# Patient Record
Sex: Female | Born: 1970 | Hispanic: Yes | Marital: Married | Smoking: Never smoker
Health system: Southern US, Community
[De-identification: ages and names within clinical notes are randomized; demographics above are authoritative.]

---

## 2008-02-12 ENCOUNTER — Ambulatory Visit: Payer: Self-pay | Admitting: Obstetrics and Gynecology

## 2008-02-26 ENCOUNTER — Ambulatory Visit: Payer: Self-pay | Admitting: Obstetrics and Gynecology

## 2008-02-26 ENCOUNTER — Inpatient Hospital Stay: Payer: Self-pay | Admitting: Obstetrics and Gynecology

## 2012-05-07 ENCOUNTER — Ambulatory Visit: Payer: Self-pay | Admitting: Internal Medicine

## 2013-07-03 ENCOUNTER — Ambulatory Visit: Payer: Self-pay

## 2014-09-17 ENCOUNTER — Ambulatory Visit: Payer: Self-pay

## 2016-11-10 ENCOUNTER — Other Ambulatory Visit: Payer: Self-pay | Admitting: Internal Medicine

## 2016-11-10 DIAGNOSIS — Z1231 Encounter for screening mammogram for malignant neoplasm of breast: Secondary | ICD-10-CM

## 2016-11-21 ENCOUNTER — Encounter (INDEPENDENT_AMBULATORY_CARE_PROVIDER_SITE_OTHER): Payer: Self-pay

## 2016-11-21 ENCOUNTER — Ambulatory Visit: Payer: Self-pay | Attending: Oncology

## 2016-11-21 ENCOUNTER — Ambulatory Visit
Admission: RE | Admit: 2016-11-21 | Discharge: 2016-11-21 | Disposition: A | Discharge status: Home / Self Care | Payer: Self-pay | Source: Ambulatory Visit | Attending: Oncology | Admitting: Oncology

## 2016-11-21 VITALS — BP 101/65 | HR 68 | Temp 98.5°F | Ht 60.63 in | Wt 164.5 lb

## 2016-11-21 DIAGNOSIS — Z0000 Encounter for general adult medical examination without abnormal findings: Secondary | ICD-10-CM

## 2016-11-21 NOTE — Progress Notes (Signed)
Letter mailed from [facility] to notify of normal mammogram results.  Patient to return in one year for annual screening.  Copy to [facility]. 

## 2016-11-21 NOTE — Progress Notes (Signed)
Subjective:     Patient ID: Noland FordyceModesta Ramon Trujillo, female   DOB: 1971/08/25, 45 y.o.   MRN: [id number]  HPI   Review of Systems     Objective:   Physical Exam  Pulmonary/Chest: Right breast exhibits no inverted nipple, no mass, no nipple discharge, no skin change and no tenderness. Left breast exhibits no inverted nipple, no mass, no nipple discharge, no skin change and no tenderness. Breasts are symmetrical.       Assessment:     45 year old Hispanic patient presents for [facility] clinic visit.  Patient screened, and meets B[facility]CP eligibility.  Patient does not have insurance, Medicare or Medicaid.  Handout given on Affordable Care Act.  Instructed patient on breast self-exam using teach back method.  CBE unremarkable.  No mass or lump palpated.    Plan:     Sent for bilateral screening mammogram.

## 2018-03-28 ENCOUNTER — Ambulatory Visit
Admission: RE | Admit: 2018-03-28 | Discharge: 2018-03-28 | Disposition: A | Discharge status: Home / Self Care | Payer: Self-pay | Source: Ambulatory Visit | Attending: Oncology | Admitting: Oncology

## 2018-03-28 ENCOUNTER — Ambulatory Visit: Payer: Self-pay | Attending: Oncology

## 2018-03-28 VITALS — BP 106/75 | HR 71 | Temp 98.5°F | Ht 60.0 in | Wt 161.0 lb

## 2018-03-28 DIAGNOSIS — Z0000 Encounter for general adult medical examination without abnormal findings: Secondary | ICD-10-CM

## 2018-03-28 NOTE — Progress Notes (Signed)
Subjective:     Patient ID: Rachel FordyceModesta Ramon Malone, female   DOB: May 06, 1971, 47 y.o.   MRN: [id number]  HPI   Review of Systems     Objective:   Physical Exam  Pulmonary/Chest: Right breast exhibits no inverted nipple, no mass, no nipple discharge, no skin change and no tenderness. Left breast exhibits no inverted nipple, no mass, no nipple discharge, no skin change and no tenderness. Breasts are symmetrical.       Assessment:     47 year old [facility].  Patient screened, and meets [facility] eligibility.  Patient does not have insurance, Medicare or Medicaid.  Handout given on Affordable Care Act.  Instructed patient on breast self awareness using teach back method.  CBE unremarkable.  No mass or lump palpated.  Delos HaringLoyda Murr interpreted exam.    Plan:     Sent for bilateral screening mammogram.

## 2018-03-28 NOTE — Progress Notes (Signed)
Sent for bilateral screening mammogram.  Letter mailed from [facility] to notify of normal mammogram results.  Patient to return in one year for annual screening.  Copy to [facility]. 

## 2020-05-27 ENCOUNTER — Other Ambulatory Visit: Payer: Self-pay

## 2020-05-27 ENCOUNTER — Other Ambulatory Visit: Payer: Self-pay | Admitting: Oncology

## 2020-05-27 ENCOUNTER — Ambulatory Visit: Payer: Self-pay | Attending: Oncology

## 2020-05-27 ENCOUNTER — Ambulatory Visit
Admission: RE | Admit: 2020-05-27 | Discharge: 2020-05-27 | Disposition: A | Discharge status: Home / Self Care | Payer: Self-pay | Source: Ambulatory Visit | Attending: Oncology | Admitting: Oncology

## 2020-05-27 VITALS — BP 115/84 | HR 68 | Temp 98.8°F | Resp 18 | Ht 59.0 in | Wt 159.6 lb

## 2020-05-27 DIAGNOSIS — Z0000 Encounter for general adult medical examination without abnormal findings: Secondary | ICD-10-CM

## 2020-05-27 NOTE — Progress Notes (Signed)
  Subjective:     Patient ID: Rachel Malone, female   DOB: 12/01/1971, 49 y.o.   MRN: [id number]  HPI   Review of Systems     Objective:   Physical Exam     Assessment:     49 year old Hispanic patient returns for [facility] screening.  Jaqui Laukaitis interpreted exam.  Patient screened, and meets BCCCP eligibility.  Patient does not have insurance, Medicare or Medicaid. Instructed patient on breast self awareness using teach back method. Clinical breast exam unremarkable.    Plan:     Sent for bilateral screening mammogram.Sent for bilateral screening mammogram.

## 2020-05-27 NOTE — Progress Notes (Signed)
Risk Assessment    No risk assessment data for the current encounter   Risk Scores      05/27/2020   Last edited by: Alta Corning, CMA   5-year risk: 0.6 %   Lifetime risk: 5.8 %        Letter mailed from [facility] to notify of normal mammogram results.  Patient to return in one year for annual screening.  Pap results pending. Pap results LSIL/HPV negative.  Previous pap 11/08/16 Negative/ no HPV testing.  Per ASCCP guidelines patient will need a repeat pap in one year.  Mailed letter with results ,and pap recommendation.

## 2020-08-25 LAB — IGP, APTIMA HPV: HPV Aptima: NEGATIVE

## 2021-06-01 ENCOUNTER — Encounter (INDEPENDENT_AMBULATORY_CARE_PROVIDER_SITE_OTHER): Payer: Self-pay

## 2021-06-01 ENCOUNTER — Other Ambulatory Visit: Payer: Self-pay

## 2021-06-01 ENCOUNTER — Ambulatory Visit
Admission: RE | Admit: 2021-06-01 | Discharge: 2021-06-01 | Disposition: A | Discharge status: Home / Self Care | Payer: Self-pay | Source: Ambulatory Visit | Attending: Oncology | Admitting: Oncology

## 2021-06-01 ENCOUNTER — Ambulatory Visit: Payer: Self-pay | Attending: Oncology | Admitting: *Deleted

## 2021-06-01 ENCOUNTER — Encounter: Payer: Self-pay | Admitting: *Deleted

## 2021-06-01 VITALS — BP 120/80 | HR 63 | Temp 97.5°F | Ht 59.0 in | Wt 159.7 lb

## 2021-06-01 DIAGNOSIS — Z0000 Encounter for general adult medical examination without abnormal findings: Secondary | ICD-10-CM | POA: Insufficient documentation

## 2021-06-01 NOTE — Progress Notes (Signed)
Subjective:     Patient ID: Rachel Malone, female   DOB: 11-Aug-1971, 50 y.o.   MRN: [id number]  HPI   [facility] Medical History Record - 06/01/21 1017      Breast History   Screening cycle New    Provider ([facility]    Initial Mammogram 06/01/21    Last Mammogram Annual    Last Mammogram Date 05/27/20    Provider (Mammogram)  Delford Field    Recent Breast Symptoms None      Breast Cancer History   Breast Cancer History No personal or family history      Previous History of Breast Problems   Breast Surgery or Biopsy None    Breast Implants N/A    BSE Done Never      Gynecological/Obstetrical History   LMP --   february 10 after 2years of not having one   Is there any chance that the client could be pregnant?  No    Age at menarche 50    Age at menopause perimenopause    PAP smear history Annually    Date of last PAP  01/28/21    Provider (PAP) [facility]    Age at first live birth 50    Breast fed children Yes (type length in comments)   3 months   DES Exposure No    Cervical, Uterine or Ovarian cancer No    Family history of Cervial, Uterine or Ovarian cancer No    Hysterectomy No    Cervix removed No    Ovaries removed No    Laser/Cryosurgery No    Current method of birth control None    Current method of Estrogen/Hormone replacement None    Smoking history None    Comments No Insurance            Review of Systems     Objective:   Physical Exam Chest:  Breasts:     Right: No swelling, bleeding, inverted nipple, mass, nipple discharge, skin change, tenderness, axillary adenopathy or supraclavicular adenopathy.     Left: No swelling, bleeding, inverted nipple, mass, nipple discharge, skin change, tenderness, axillary adenopathy or supraclavicular adenopathy.    Lymphadenopathy:     Upper Body:     Right upper body: No supraclavicular or axillary adenopathy.     Left upper body: No supraclavicular or axillary adenopathy.         Assessment:     50 year old English speaking Hispanic female returns to [facility] for annual screening.  Clinical breast exam is unremarkable. Taught self breast awareness.  Last pap on 01/28/21 was negative / LGSIL.  Per ASCCP guidelines, next pap will be due in 1 year.  Patient states she has had 1 episode of post menopausal spotting in February of 2022.  States this was after 2 years of no periods, and lasted about 1-2 days.  Denies post coital bleeding, and states it was not after her pap smear, but at a different time.  Discussed that [facility] does not cover referrals for post menopausal bleeding, but that I feel like she should probably have a gyn consult for the that episode of bleeding.  Discussed with her to inform her PCP at the [facility], and that I will also send them my notes.  Patient has been screened for eligibility.  She does not have any insurance, Medicare or Medicaid.  She also meets financial eligibility.   Risk Assessment    Risk Scores  06/01/2021 05/27/2020   Last edited by: Scarlett Presto, RN [place], Freada Bergeron, CMA   5-year risk: 0.6 % 0.6 %   Lifetime risk: 5.7 % 5.8 %            Plan:     Screening mammogram ordered.  Routed notes to Lorenda Cahill, FNP at the [facility]  Will follow up per [facility] protocol.

## 2021-06-01 NOTE — Patient Instructions (Signed)
Gave patient hand-out, Women Staying Healthy, Active and Well from [facility], with education on breast health, pap smears, heart and colon health. 

## 2021-06-03 ENCOUNTER — Encounter: Payer: Self-pay | Admitting: *Deleted

## 2021-06-03 NOTE — Progress Notes (Signed)
Letter mailed from [facility] to inform patient of her normal mammogram results.  Patient is to follow-up with annual screening in one year. 

## 2022-10-24 IMAGING — MG MM DIGITAL SCREENING BILAT W/ TOMO AND CAD
8 series · 8 of 24 positions shown · non-contrast
Comparison: Previous exam(s).

CLINICAL DATA: Screening.

EXAM:
DIGITAL SCREENING BILATERAL MAMMOGRAM WITH TOMOSYNTHESIS AND CAD
TECHNIQUE: Bilateral screening digital craniocaudal and mediolateral oblique
mammograms were obtained. Bilateral screening digital breast
tomosynthesis was performed. The images were evaluated with
computer-aided detection.

[L CC synth-2D]
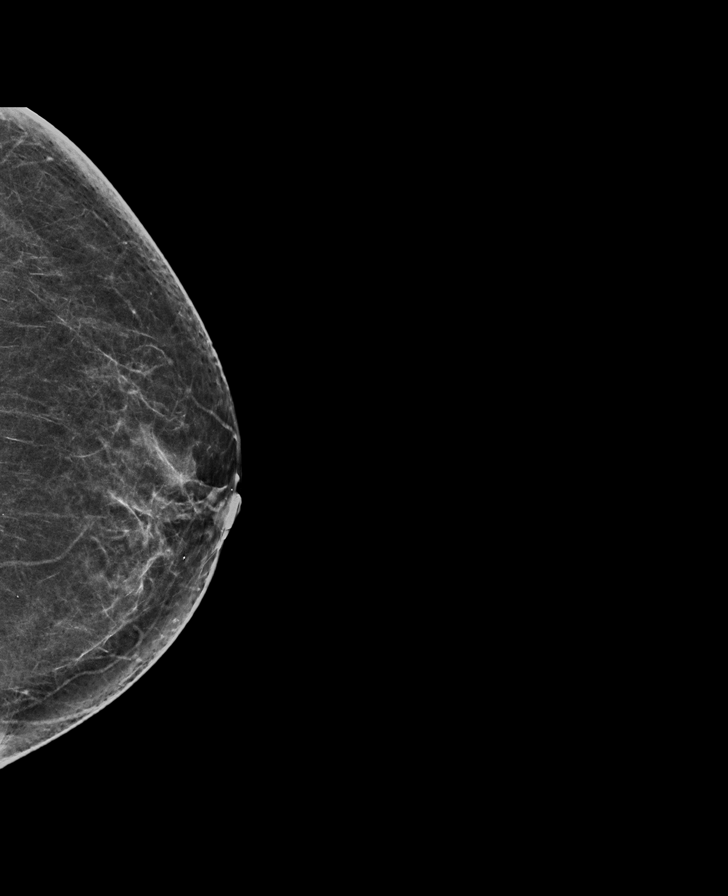

[L MLO synth-2D]
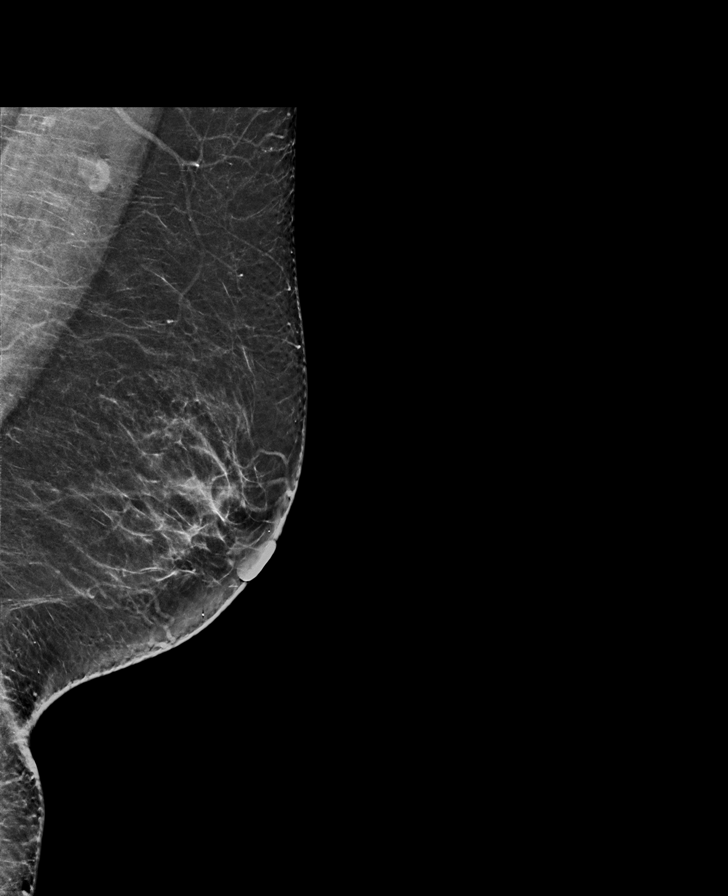

[R MLO synth-2D]
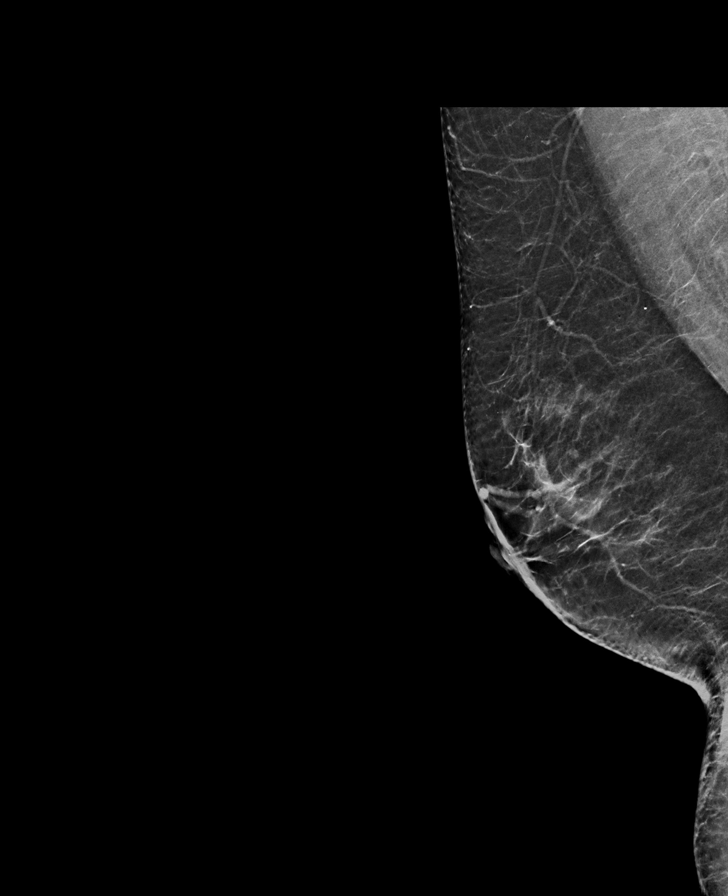

[R CC synth-2D]
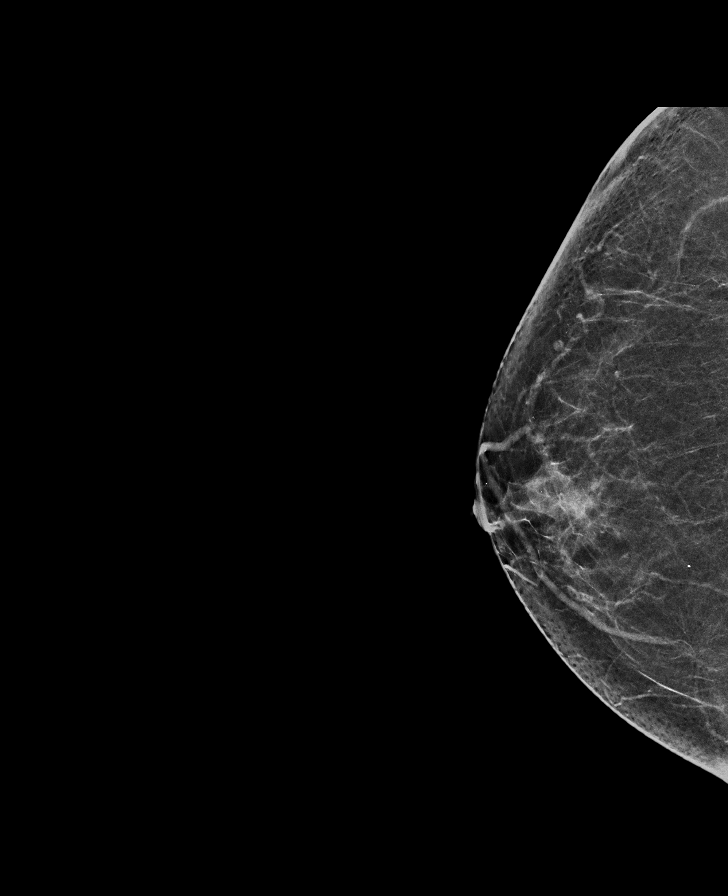

[R CC tomo · tomo slice 32/63.0]
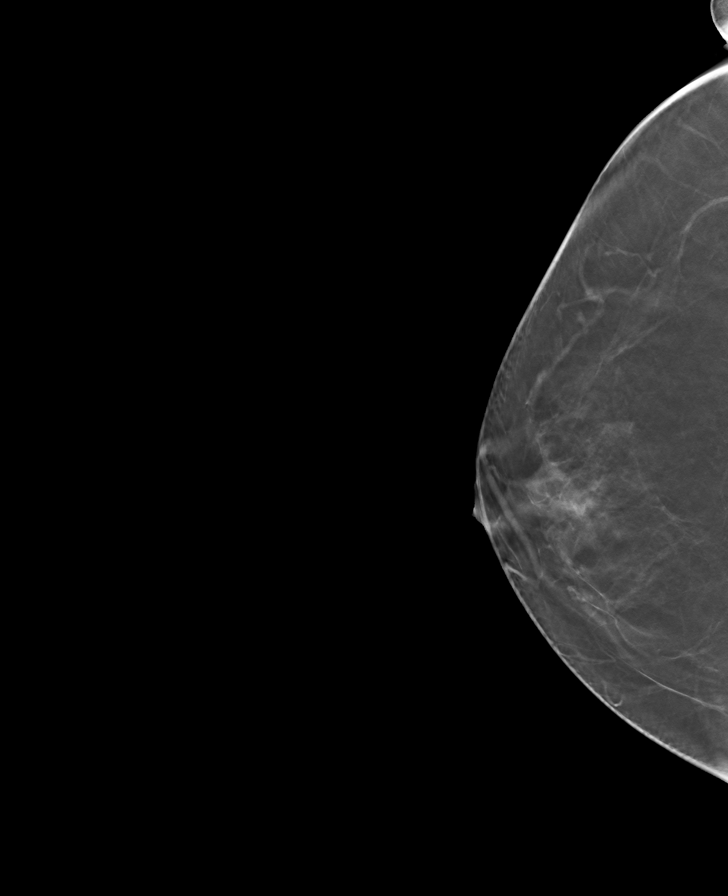

[L CC tomo · tomo slice 33/66.0]
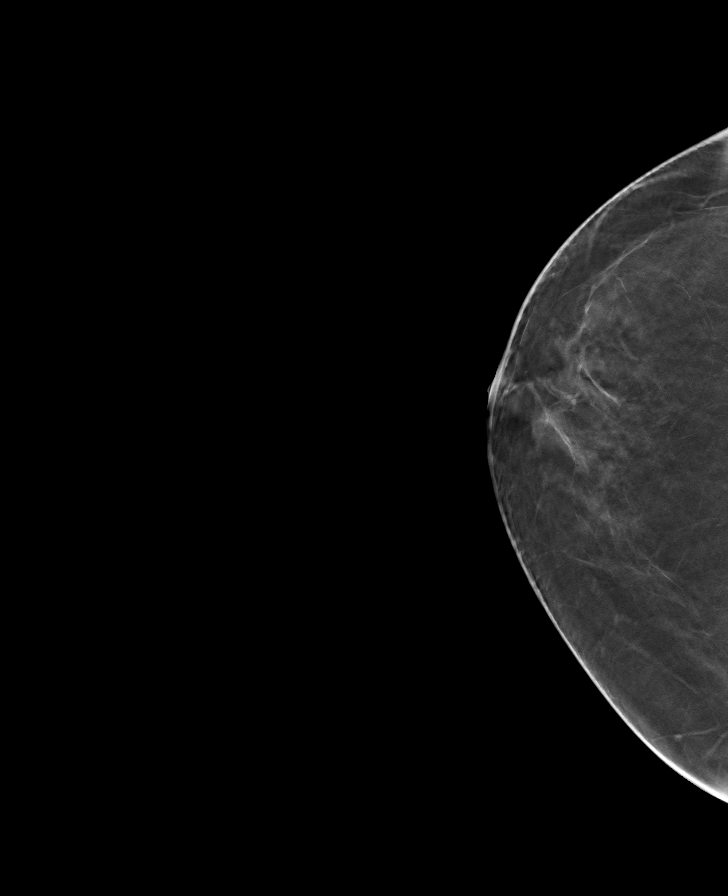

[L MLO tomo · tomo slice 33/66.0]
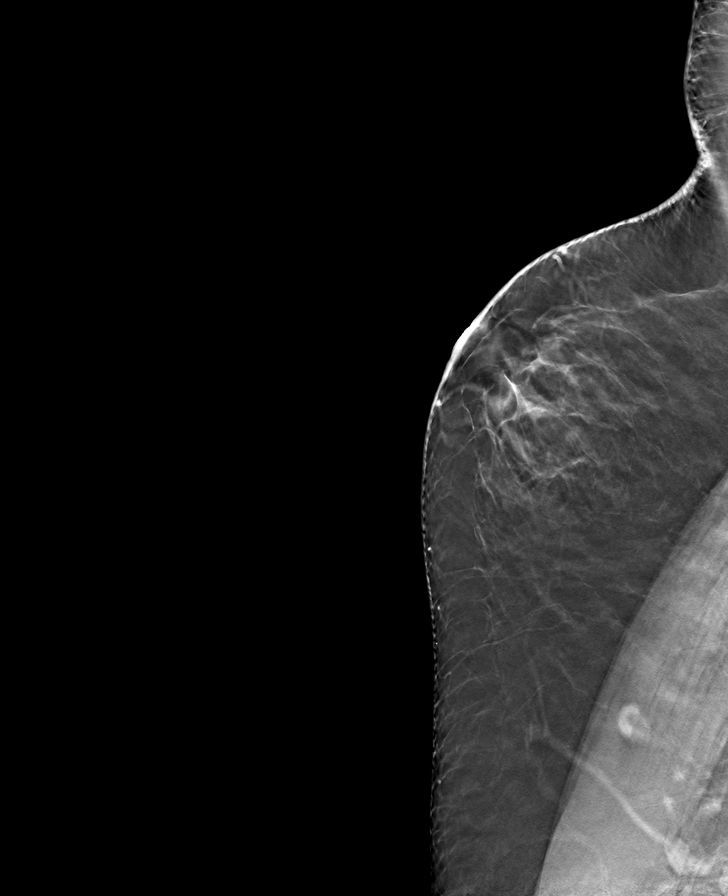

[R MLO tomo · tomo slice 33/65.0]
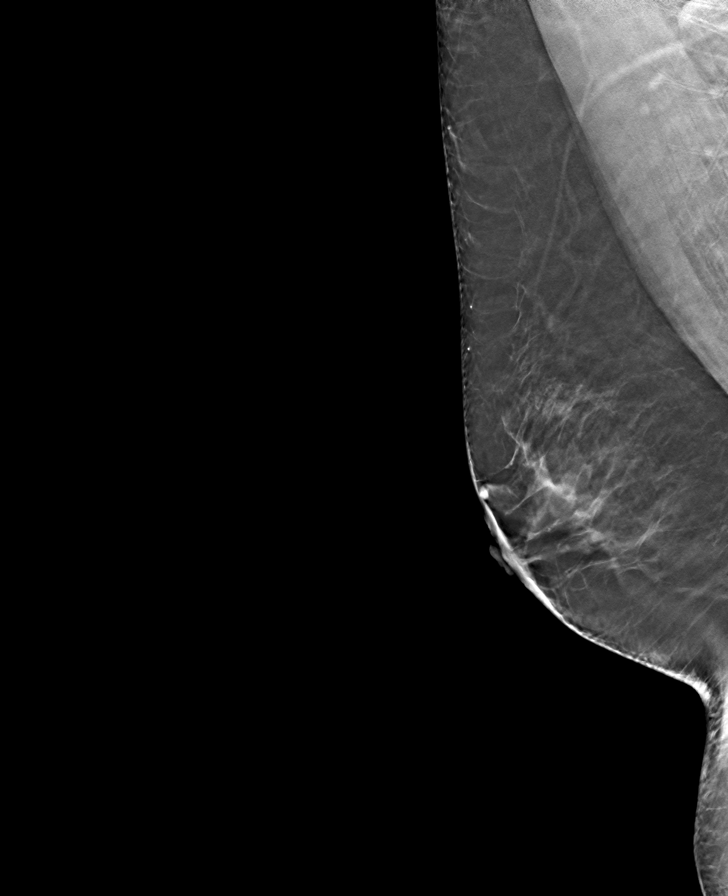

[8 of 24 positions shown; findings below may reference images not displayed]

ACR Breast Density Category b: There are scattered areas of
fibroglandular density.
FINDINGS: There are no findings suspicious for malignancy. The images were
evaluated with computer-aided detection.
IMPRESSION: No mammographic evidence of malignancy. A result letter of this
screening mammogram will be mailed directly to the patient.

RECOMMENDATION:
Screening mammogram in one year. (Code:WJ-I-BG6)

BI-RADS CATEGORY  1: Negative.

## 2022-11-25 ENCOUNTER — Telehealth: Payer: Self-pay

## 2022-11-25 ENCOUNTER — Other Ambulatory Visit: Payer: Self-pay

## 2022-11-25 DIAGNOSIS — Z1231 Encounter for screening mammogram for malignant neoplasm of breast: Secondary | ICD-10-CM

## 2022-11-25 NOTE — Telephone Encounter (Signed)
Charted on incorrect patient.

## 2022-11-25 NOTE — Telephone Encounter (Deleted)
Via Delorise Royals, Spanish Interpreter [facility]) Patient was contacted to determine if she was a [place] citizen which would determine if she could apply for [facility] as she was diagnosed with Breast Cancer. Patient stated that she was not a [place] citizen. Financial assistance application mailed to patient.

## 2022-11-29 ENCOUNTER — Other Ambulatory Visit: Payer: Self-pay

## 2022-11-29 ENCOUNTER — Ambulatory Visit: Payer: Self-pay | Attending: Hematology and Oncology | Admitting: Hematology and Oncology

## 2022-11-29 ENCOUNTER — Ambulatory Visit
Admission: RE | Admit: 2022-11-29 | Discharge: 2022-11-29 | Disposition: A | Discharge status: Home / Self Care | Payer: Self-pay | Source: Ambulatory Visit | Attending: Obstetrics and Gynecology | Admitting: Obstetrics and Gynecology

## 2022-11-29 VITALS — BP 118/91 | Wt 167.8 lb

## 2022-11-29 DIAGNOSIS — Z1231 Encounter for screening mammogram for malignant neoplasm of breast: Secondary | ICD-10-CM | POA: Insufficient documentation

## 2022-11-29 DIAGNOSIS — Z1211 Encounter for screening for malignant neoplasm of colon: Secondary | ICD-10-CM

## 2022-11-29 NOTE — Progress Notes (Signed)
[name] is Malone 51 y.o. female who presents to [facility] clinic today with no complaints.    Pap Smear: Pap not smear completed today. Last Pap smear was 2022 at [facility] clinic and was abnormal - LGSIL/ HPV- . Per patient has history of an abnormal Pap smear. Last Pap smear result is available in Epic. 2021 LGSIL/HPV- She will return to [place] clinic for repeat Pap smear.    Physical exam: Breasts Breasts symmetrical. No skin abnormalities bilateral breasts. No nipple retraction bilateral breasts. No nipple discharge bilateral breasts. No lymphadenopathy. No lumps palpated bilateral breasts.     MS DIGITAL SCREENING TOMO BILATERAL  Result Date: 06/02/2021 CLINICAL DATA:  Screening. EXAM: DIGITAL SCREENING BILATERAL MAMMOGRAM WITH TOMOSYNTHESIS AND CAD TECHNIQUE: Bilateral screening digital craniocaudal and mediolateral oblique mammograms were obtained. Bilateral screening digital breast tomosynthesis was performed. The images were evaluated with computer-aided detection. COMPARISON:  Previous exam(s). ACR Breast Density Category b: There are scattered areas of fibroglandular density. FINDINGS: There are no findings suspicious for malignancy. The images were evaluated with computer-aided detection. IMPRESSION: No mammographic evidence of malignancy. Malone result letter of this screening mammogram will be mailed directly to the patient. RECOMMENDATION: Screening mammogram in one year. (Code:SM-B-01Y) BI-RADS CATEGORY  1: Negative. Electronically Signed   By: Sherian Rein M.D.   On: 06/02/2021 14:12   MS DIGITAL SCREENING TOMO BILATERAL  Result Date: 05/28/2020 CLINICAL DATA:  Screening. EXAM: DIGITAL SCREENING BILATERAL MAMMOGRAM WITH TOMO AND CAD COMPARISON:  Previous exam(s). ACR Breast Density Category b: There are scattered areas of fibroglandular density. FINDINGS: There are no findings suspicious for malignancy. Images were processed with CAD. IMPRESSION: No mammographic evidence of malignancy.  Malone result letter of this screening mammogram will be mailed directly to the patient. RECOMMENDATION: Screening mammogram in one year. (Code:SM-B-01Y) BI-RADS CATEGORY  1: Negative. Electronically Signed   By: Gerome Sam III M.D   On: 05/28/2020 10:34   MS DIGITAL SCREENING TOMO BILATERAL  Result Date: 03/28/2018 CLINICAL DATA:  Screening. EXAM: DIGITAL SCREENING BILATERAL MAMMOGRAM WITH TOMO AND CAD COMPARISON:  Previous exam(s). ACR Breast Density Category c: The breast tissue is heterogeneously dense, which may obscure small masses. FINDINGS: There are no findings suspicious for malignancy. Images were processed with CAD. IMPRESSION: No mammographic evidence of malignancy. Malone result letter of this screening mammogram will be mailed directly to the patient. RECOMMENDATION: Screening mammogram in one year. (Code:SM-B-01Y) BI-RADS CATEGORY  1: Negative. Electronically Signed   By: Beckie Salts M.D.   On: 03/28/2018 14:47      Pelvic/Bimanual Pap is not indicated today    Smoking History: Patient has never smoked and was not referred to quit line.    Patient Navigation: Patient education provided. Access to services provided for patient through [facility] program. No interpreter provided. No transportation provided   Colorectal Cancer Screening: Per patient has never had colonoscopy completed No complaints today. FIT test given today.    Breast and Cervical Cancer Risk Assessment: Patient does not have family history of breast cancer, known genetic mutations, or radiation treatment to the chest before age 67. Patient does not have history of cervical dysplasia, immunocompromised, or DES exposure in-utero.  Risk Assessment   No risk assessment data for the current encounter  Risk Scores       06/01/2021   Last edited by: Scarlett Presto, RN   5-year risk: 0.6 %   Lifetime risk: 5.7 %  Malone: [facility] exam without pap smear No complaints with benign exam.   P: [facility]. Appointment scheduled 11/29/22.  Rachel Basset A, NP 11/29/2022 8:58 AM

## 2022-11-29 NOTE — Patient Instructions (Signed)
Taught Rachel Malone about breast self awareness and gave educational materials to take home. Patient did not need a Pap smear today due to last Pap smear was in 2022 per patient. She will have repeat Pap smear in the next couple of months with [facility] for follow up LGSIL/ HPV- Let her know [facility] will cover Pap smears every 5 years unless has a history of abnormal Pap smears. [facility]. Appointment scheduled for 11/29/22. Patient aware of appointment and will be there. Let patient know will follow up with her within the next couple weeks with results. Rachel Malone verbalized understanding.  Pascal Lux, NP 9:02 AM

## 2022-12-09 LAB — FECAL OCCULT BLOOD, IMMUNOCHEMICAL: Fecal Occult Bld: NEGATIVE
